# Patient Record
Sex: Male | Born: 1965
Health system: Southern US, Community
[De-identification: ages and names within clinical notes are randomized; demographics above are authoritative.]

## PROBLEM LIST (undated history)

## (undated) DIAGNOSIS — Z789 Other specified health status: Secondary | ICD-10-CM

## (undated) HISTORY — DX: Other specified health status: Z78.9

## (undated) HISTORY — PX: NO PAST SURGERIES: SHX2092

---

## 2004-12-10 ENCOUNTER — Emergency Department (HOSPITAL_COMMUNITY): Admission: EM | Admit: 2004-12-10 | Discharge: 2004-12-10 | Payer: Self-pay | Admitting: Family Medicine
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
## 2016-02-02 ENCOUNTER — Ambulatory Visit (INDEPENDENT_AMBULATORY_CARE_PROVIDER_SITE_OTHER): Payer: 59 | Admitting: Family Medicine

## 2016-02-02 ENCOUNTER — Encounter: Payer: Self-pay | Admitting: Family Medicine

## 2016-02-02 VITALS — BP 114/76 | HR 84 | Temp 98.0°F | Ht 71.0 in | Wt 205.4 lb

## 2016-02-02 DIAGNOSIS — R6889 Other general symptoms and signs: Secondary | ICD-10-CM | POA: Diagnosis not present

## 2016-02-02 DIAGNOSIS — Z0001 Encounter for general adult medical examination with abnormal findings: Secondary | ICD-10-CM | POA: Diagnosis not present

## 2016-02-02 DIAGNOSIS — Z720 Tobacco use: Secondary | ICD-10-CM

## 2016-02-02 DIAGNOSIS — S76301A Unspecified injury of muscle, fascia and tendon of the posterior muscle group at thigh level, right thigh, initial encounter: Secondary | ICD-10-CM

## 2016-02-02 DIAGNOSIS — M79671 Pain in right foot: Secondary | ICD-10-CM | POA: Diagnosis not present

## 2016-02-02 DIAGNOSIS — S76309A Unspecified injury of muscle, fascia and tendon of the posterior muscle group at thigh level, unspecified thigh, initial encounter: Secondary | ICD-10-CM | POA: Insufficient documentation

## 2016-02-02 MED ORDER — NICOTINE POLACRILEX 2 MG MT GUM: 2.0000 mg | CHEWING_GUM | OROMUCOSAL | Status: DC | PRN

## 2016-02-02 NOTE — Assessment & Plan Note (Signed)
Suspect midfoot arthritis given history and exam. Mild bony prominence today. Otherwise normal exam. Discussed obtaining an x-ray to evaluate given persistence, though patient declined this at this time. Patient will continue to monitor. Can ice and use ibuprofen as needed. Given return precautions.

## 2016-02-02 NOTE — Progress Notes (Signed)
Pre visit review using our clinic review tool, if applicable. No additional management support is needed unless otherwise documented below in the visit note. 

## 2016-02-02 NOTE — Assessment & Plan Note (Signed)
Patient has not had an annual exam in some time. No lab work recently. Discussed diet and exercise. Assistance provided in quitting smoking. Discussed prostate cancer screening and patient declined this at this time. Patient will return for lab work.

## 2016-02-02 NOTE — Assessment & Plan Note (Signed)
Suspect possible strain of right hamstring as cause of discomfort in this area. Benign exam today. Patient will continue to monitor. If recurs he will follow-up. Given return precautions.

## 2016-02-02 NOTE — Patient Instructions (Signed)
Nice to meet you. We will try the nicotine gum for smoking cessation. You can take this every 1-2 hours for the next 6 weeks. We will then work on decreasing this frequency. Please return at your convenience for lab work. Monitor the area on your foot and when it recurs please let us know so we can evaluate it. You can also use ice and ibuprofen for this.

## 2016-02-02 NOTE — Progress Notes (Signed)
Patient ID: Geoffrey MINUS, male   DOB: 1966/02/12, 50 y.o.   MRN: [id number]  Geoffrey Rumps, MD Phone: [phone]  Geoffrey Herrera is a 50 y.o. male who presents today for new patient visit.  Patient presents to establish care. Also for evaluation of his right foot.  Patient reports he does not exercise. 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 Diet consists of eating whatever he wants whenever he wants. His weight is up mildly as he has not been active over the winter. Drinks one beer per day. Smokes three quarters of a pack of cigarettes daily. Previously quit for about 10 years by going cold [place]. He notes cough related to smoking. No shortness of breath with this. Nonproductive. Coughs 2-3 times a day. No illicit drug use. Has not had lab work done in many years. No family history of prostate cancer.  Right foot discomfort: Patient notes intermittent since last summer the dorsal aspect of his right foot has hurt minimally. There is a swelling in the midfoot that comes and goes. No change in color or itching. No injury to this area. Does not bother him at this time.  Patient additionally notes the lateral aspect of his right distal hamstring tendon occasionally hurts when he has to squat for a long period of time. Notes this comes and goes. There is no swelling in this area. No pain at this time.  Active Ambulatory Problems    Diagnosis Date Noted  . Right foot pain 02/02/2016  . Hamstring injury 02/02/2016  . Tobacco abuse 02/02/2016  . Encounter for general adult medical examination with abnormal findings 02/02/2016   Resolved Ambulatory Problems    Diagnosis Date Noted  . No Resolved Ambulatory Problems   Past Medical History  Diagnosis Date  . Patient denies medical problems     Family History  Problem Relation Age of Onset  . Lung cancer      Uncle  . Diabetes Father   . Kidney disease Father   . Heart attack Father 101    Social History   Social  History  . Marital Status: Married    Spouse Name: N/A  . Number of Children: N/A  . Years of Education: N/A   Occupational History  . Not on file.   Social History Main Topics  . Smoking status: Current Every Day Smoker  . Smokeless tobacco: Not on file  . Alcohol Use: 3.0 oz/week    5 Standard drinks or equivalent per week     Comment: 5 beers a week   . Drug Use: No  . Sexual Activity: Not on file   Other Topics Concern  . Not on file   Social History Narrative  . No narrative on file    ROS   General:  Negative for nexplained weight loss, fever Skin: Negative for new or changing mole, sore that won't heal HEENT: Negative for trouble hearing, trouble seeing, ringing in ears, mouth sores, hoarseness, change in voice, dysphagia. CV:  Negative for chest pain, dyspnea, edema, palpitations Resp: Positive for cough, Negative for dyspnea, hemoptysis GI: Negative for nausea, vomiting, diarrhea, constipation, abdominal pain, melena, hematochezia. GU: Negative for dysuria, incontinence, urinary hesitance, hematuria, vaginal or penile discharge, polyuria, sexual difficulty, lumps in testicle or breasts MSK: Positive for right foot pain, Negative for muscle cramps or aches, joint pain or swelling Neuro: Negative for headaches, weakness, numbness, dizziness, passing out/fainting Psych: Negative for depression, anxiety, positive for memory  problems (forgets simple things like remembering his lunch)  Objective  Physical Exam Filed Vitals:   02/02/16 1026  BP: 114/76  Pulse: 84  Temp: 98 F (36.7 C)    BP Readings from Last 3 Encounters:  02/02/16 114/76   Wt Readings from Last 3 Encounters:  02/02/16 205 lb 6.4 oz (93.169 kg)    Physical Exam  Constitutional: He is well-developed, well-nourished, and in no distress.  HENT:  Head: Normocephalic and atraumatic.  Right Ear: External ear normal.  Left Ear: External ear normal.  Mouth/Throat: Oropharynx is clear and  moist. No oropharyngeal exudate.  Eyes: Conjunctivae are normal. Pupils are equal, round, and reactive to light.  Neck: Neck supple.  Cardiovascular: Normal rate, regular rhythm and normal heart sounds.  Exam reveals no gallop and no friction rub.   No murmur heard. Pulmonary/Chest: Effort normal and breath sounds normal. No respiratory distress. He has no wheezes. He has no rales.  Abdominal: Soft. He exhibits no distension. There is no tenderness. There is no rebound and no guarding.  Musculoskeletal:  Mild bony prominence noted in right dorsal midfoot that is nontender, nonerythematous, and has no surrounding edema, bilateral knees with no joint line tenderness, swelling, ligamentous laxity, and negative McMurray's, no tenderness of the right lateral distal hamstring tendons  Lymphadenopathy:    He has no cervical adenopathy.  Neurological: He is alert. Gait normal.  5 out of 5 strength bilateral quads, hamstrings, plantar flexion, and dorsiflexion, sensation light touch intact in bilateral lower extremities, 2+ patellar reflexes  Skin: Skin is warm and dry. He is not diaphoretic.  Psychiatric: Mood and affect normal.     Assessment/Plan:   Right foot pain Suspect midfoot arthritis given history and exam. Mild bony prominence today. Otherwise normal exam. Discussed obtaining an x-ray to evaluate given persistence, though patient declined this at this time. Patient will continue to monitor. Can ice and use ibuprofen as needed. Given return precautions.  Hamstring injury Suspect possible strain of right hamstring as cause of discomfort in this area. Benign exam today. Patient will continue to monitor. If recurs he will follow-up. Given return precautions.  Tobacco abuse Patient would like to quit smoking. Discussed options today. Patient wants to trial nicotine gum. Prescription provided.  Encounter for general adult medical examination with abnormal findings Patient has not had an  annual exam in some time. No lab work recently. Discussed diet and exercise. Assistance provided in quitting smoking. Discussed prostate cancer screening and patient declined this at this time. Patient will return for lab work.    Orders Placed This Encounter  Procedures  . Lipid Profile    Standing Status: Future     Number of Occurrences:      Standing Expiration Date: 02/01/2017  . Comp Met (CMET)    Standing Status: Future     Number of Occurrences:      Standing Expiration Date: 02/01/2017  . HgB A1c    Standing Status: Future     Number of Occurrences:      Standing Expiration Date: 02/01/2017  . CBC    Standing Status: Future     Number of Occurrences:      Standing Expiration Date: 02/01/2017  . TSH    Standing Status: Future     Number of Occurrences:      Standing Expiration Date: 02/01/2017    Meds ordered this encounter  Medications  . nicotine polacrilex (NICORETTE) 2 MG gum    Sig: Take 1 each (  2 mg total) by mouth as needed for smoking cessation.    Dispense:  100 tablet    Refill:  0     Geoffrey Herrera

## 2016-02-02 NOTE — Assessment & Plan Note (Signed)
Patient would like to quit smoking. Discussed options today. Patient wants to trial nicotine gum. Prescription provided.

## 2016-02-09 ENCOUNTER — Other Ambulatory Visit (INDEPENDENT_AMBULATORY_CARE_PROVIDER_SITE_OTHER): Payer: 59

## 2016-02-09 DIAGNOSIS — Z0001 Encounter for general adult medical examination with abnormal findings: Secondary | ICD-10-CM

## 2016-02-09 DIAGNOSIS — R6889 Other general symptoms and signs: Secondary | ICD-10-CM | POA: Diagnosis not present

## 2016-02-09 LAB — CBC
HEMATOCRIT: 44.2 % (ref 39.0–52.0)
Hemoglobin: 15 g/dL (ref 13.0–17.0)
MCHC: 33.8 g/dL (ref 30.0–36.0)
MCV: 89.3 fl (ref 78.0–100.0)
Platelets: 278 10*3/uL (ref 150.0–400.0)
RBC: 4.95 Mil/uL (ref 4.22–5.81)
RDW: 13.9 % (ref 11.5–15.5)
WBC: 9.3 10*3/uL (ref 4.0–10.5)

## 2016-02-09 LAB — COMPREHENSIVE METABOLIC PANEL
ALBUMIN: 4.4 g/dL (ref 3.5–5.2)
ALK PHOS: 46 U/L (ref 39–117)
ALT: 24 U/L (ref 0–53)
AST: 19 U/L (ref 0–37)
BUN: 13 mg/dL (ref 6–23)
CO2: 27 mEq/L (ref 19–32)
Calcium: 9.5 mg/dL (ref 8.4–10.5)
Chloride: 103 mEq/L (ref 96–112)
Creatinine, Ser: 1.11 mg/dL (ref 0.40–1.50)
GFR: 90.64 mL/min (ref >60.00)
Glucose, Bld: 104 mg/dL — ABNORMAL HIGH (ref 70–99)
Potassium: 4.3 mEq/L (ref 3.5–5.1)
Sodium: 137 mEq/L (ref 135–145)
Total Bilirubin: 0.6 mg/dL (ref 0.2–1.2)
Total Protein: 7.5 g/dL (ref 6.0–8.3)

## 2016-02-09 LAB — LIPID PANEL
Cholesterol: 124 mg/dL (ref 0–200)
HDL: 52.7 mg/dL (ref >39.00)
LDL Cholesterol: 36 mg/dL (ref 0–99)
NonHDL: 71
Total CHOL/HDL Ratio: 2
Triglycerides: 175 mg/dL — ABNORMAL HIGH (ref 0.0–149.0)
VLDL: 35 mg/dL (ref 0.0–40.0)

## 2016-02-09 LAB — HEMOGLOBIN A1C: Hgb A1c MFr Bld: 5.9 % (ref 4.6–6.5)

## 2016-02-09 LAB — TSH: TSH: 0.78 u[IU]/mL (ref 0.35–4.50)

## 2019-07-19 ENCOUNTER — Encounter: Payer: Self-pay | Admitting: Internal Medicine

## 2019-07-19 ENCOUNTER — Other Ambulatory Visit: Payer: Self-pay

## 2019-07-19 ENCOUNTER — Ambulatory Visit: Payer: 59 | Admitting: Internal Medicine

## 2019-07-19 VITALS — BP 128/84 | HR 62 | Temp 98.7°F | Ht 70.5 in | Wt 198.0 lb

## 2019-07-19 DIAGNOSIS — R1032 Left lower quadrant pain: Secondary | ICD-10-CM

## 2019-07-19 DIAGNOSIS — Z1211 Encounter for screening for malignant neoplasm of colon: Secondary | ICD-10-CM

## 2019-07-19 DIAGNOSIS — Z125 Encounter for screening for malignant neoplasm of prostate: Secondary | ICD-10-CM | POA: Diagnosis not present

## 2019-07-19 DIAGNOSIS — Z23 Encounter for immunization: Secondary | ICD-10-CM | POA: Diagnosis not present

## 2019-07-19 DIAGNOSIS — Z0000 Encounter for general adult medical examination without abnormal findings: Secondary | ICD-10-CM

## 2019-07-19 LAB — LIPID PANEL
Cholesterol: 114 mg/dL (ref 0–200)
HDL: 52.1 mg/dL (ref >39.00)
LDL Cholesterol: 33 mg/dL (ref 0–99)
NonHDL: 62.01
Total CHOL/HDL Ratio: 2
Triglycerides: 147 mg/dL (ref 0.0–149.0)
VLDL: 29.4 mg/dL (ref 0.0–40.0)

## 2019-07-19 LAB — CBC
HCT: 44.5 % (ref 39.0–52.0)
Hemoglobin: 15 g/dL (ref 13.0–17.0)
MCHC: 33.7 g/dL (ref 30.0–36.0)
MCV: 92.4 fl (ref 78.0–100.0)
Platelets: 283 10*3/uL (ref 150.0–400.0)
RBC: 4.81 Mil/uL (ref 4.22–5.81)
RDW: 13.3 % (ref 11.5–15.5)
WBC: 7.5 10*3/uL (ref 4.0–10.5)

## 2019-07-19 LAB — COMPREHENSIVE METABOLIC PANEL
ALT: 26 U/L (ref 0–53)
AST: 21 U/L (ref 0–37)
Albumin: 4.3 g/dL (ref 3.5–5.2)
Alkaline Phosphatase: 49 U/L (ref 39–117)
BUN: 11 mg/dL (ref 6–23)
CO2: 28 mEq/L (ref 19–32)
Calcium: 9.7 mg/dL (ref 8.4–10.5)
Chloride: 106 mEq/L (ref 96–112)
Creatinine, Ser: 1.06 mg/dL (ref 0.40–1.50)
GFR: 88.71 mL/min (ref >60.00)
Glucose, Bld: 88 mg/dL (ref 70–99)
Potassium: 5 mEq/L (ref 3.5–5.1)
Sodium: 139 mEq/L (ref 135–145)
Total Bilirubin: 0.5 mg/dL (ref 0.2–1.2)
Total Protein: 7.2 g/dL (ref 6.0–8.3)

## 2019-07-19 LAB — HEMOGLOBIN A1C: Hgb A1c MFr Bld: 6 % (ref 4.6–6.5)

## 2019-07-19 LAB — PSA: PSA: 0.84 ng/mL (ref 0.10–4.00)

## 2019-07-19 NOTE — Addendum Note (Signed)
Addended by: Lurlean Nanny on: 07/19/2019 03:41 PM   Modules accepted: Orders

## 2019-07-19 NOTE — Progress Notes (Signed)
HPI  Pt presents to the clinic today to establish care. He has not had a PCP in 3+ years.  Flu: never Tetanus: > 10 years ago PSA Screening: never Colon Screening: never Vision Screening: as needed Dentist: as needed  Diet: He does eat meat. He consumes fruits and veggies daily. He does eat some fried foods. He drinks mostly water, juice. Exercise: Weights  Past Medical History:  Diagnosis Date  . Patient denies medical problems     No current outpatient medications on file.   No current facility-administered medications for this visit.     No Known Allergies  Family History  Problem Relation Age of Onset  . Lung cancer Other        Uncle  . Diabetes Father   . Kidney disease Father   . Heart attack Father 42  . Cancer Paternal Grandmother     Social History   Socioeconomic History  . Marital status: Single    Spouse name: Not on file  . Number of children: Not on file  . Years of education: Not on file  . Highest education level: Not on file  Occupational History  . Not on file  Social Needs  . Financial resource strain: Not on file  . Food insecurity    Worry: Not on file    Inability: Not on file  . Transportation needs    Medical: Not on file    Non-medical: Not on file  Tobacco Use  . Smoking status: Current Every Day Smoker    Packs/day: 0.33    Types: Cigarettes  Substance and Sexual Activity  . Alcohol use: Yes    Alcohol/week: 5.0 standard drinks    Types: 5 Standard drinks or equivalent per week    Comment: occasional  . Drug use: No  . Sexual activity: Not on file  Lifestyle  . Physical activity    Days per week: Not on file    Minutes per session: Not on file  . Stress: Not on file  Relationships  . Social Herbalist on phone: Not on file    Gets together: Not on file    Attends religious service: Not on file    Active member of club or organization: Not on file    Attends meetings of clubs or organizations: Not on file     Relationship status: Not on file  . Intimate partner violence    Fear of current or ex partner: Not on file    Emotionally abused: Not on file    Physically abused: Not on file    Forced sexual activity: Not on file  Other Topics Concern  . Not on file  Social History Narrative  . Not on file    ROS:  Constitutional: Denies fever, malaise, fatigue, headache or abrupt weight changes.  HEENT: Denies eye pain, eye redness, ear pain, ringing in the ears, wax buildup, runny nose, nasal congestion, bloody nose, or sore throat. Respiratory: Denies difficulty breathing, shortness of breath, cough or sputum production.   Cardiovascular: Denies chest pain, chest tightness, palpitations or swelling in the hands or feet.  Gastrointestinal: Pt reports intermittent LLQ pain, worse with movement. Denies abdominal pain, bloating, constipation, diarrhea or blood in the stool.  GU: Denies frequency, urgency, pain with urination, blood in urine, odor or discharge. Musculoskeletal: Denies decrease in range of motion, difficulty with gait, muscle pain or joint pain and swelling.  Skin: Denies redness, rashes, lesions or ulcercations.  Neurological:  Denies dizziness, difficulty with memory, difficulty with speech or problems with balance and coordination.  Psych: Denies anxiety, depression, SI/HI.  No other specific complaints in a complete review of systems (except as listed in HPI above).  PE:  BP 128/84   Pulse 62   Temp 98.7 F (37.1 C) (Temporal)   Ht 5' 10.5" (1.791 m)   Wt 198 lb (89.8 kg)   SpO2 98%   BMI 28.01 kg/m  Wt Readings from Last 3 Encounters:  07/19/19 198 lb (89.8 kg)  02/02/16 205 lb 6.4 oz (93.2 kg)    General: Appears his stated age, well developed, well nourished in NAD. HEENT: Head: normal shape and size; Eyes: sclera white, no icterus, conjunctiva pink, PERRLA and EOMs intact; Ears: Tm's gray and intact, normal light reflex; Neck: Neck supple, trachea midline. No  masses, lumps or thyromegaly present.  Cardiovascular: Normal rate and rhythm. S1,S2 noted.  No murmur, rubs or gallops noted. No JVD or BLE edema. No carotid bruits noted. Pulmonary/Chest: Normal effort and positive vesicular breath sounds. No respiratory distress. No wheezes, rales or ronchi noted.  Abdomen: Soft and nontender. Normal bowel sounds. No distention or masses noted. Liver, spleen and kidneys non palpable. Musculoskeletal: Strength 5/5 BUE/BLE.  No difficulty with gait.  Neurological: Alert and oriented. Cranial nerves II-XII grossly intact. Coordination normal.  Psychiatric: Mood and affect normal. Behavior is normal. Judgment and thought content normal.     BMET    Component Value Date/Time   NA 137 02/09/2016 1000   K 4.3 02/09/2016 1000   CL 103 02/09/2016 1000   CO2 27 02/09/2016 1000   GLUCOSE 104 (H) 02/09/2016 1000   BUN 13 02/09/2016 1000   CREATININE 1.11 02/09/2016 1000   CALCIUM 9.5 02/09/2016 1000    Lipid Panel     Component Value Date/Time   CHOL 124 02/09/2016 1000   TRIG 175.0 (H) 02/09/2016 1000   HDL 52.70 02/09/2016 1000   CHOLHDL 2 02/09/2016 1000   VLDL 35.0 02/09/2016 1000   LDLCALC 36 02/09/2016 1000    CBC    Component Value Date/Time   WBC 9.3 02/09/2016 1000   RBC 4.95 02/09/2016 1000   HGB 15.0 02/09/2016 1000   HCT 44.2 02/09/2016 1000   PLT 278.0 02/09/2016 1000   MCV 89.3 02/09/2016 1000   MCHC 33.8 02/09/2016 1000   RDW 13.9 02/09/2016 1000    Hgb A1C Lab Results  Component Value Date   HGBA1C 5.9 02/09/2016     Assessment and Plan:  Preventive Health Maintenance:  Encouraged him to get a flu shot in the fall Tdap today Referral placed to GI for for screening colonoscopy Encouraged him to consume a balanced diet and exercise regimen Advised him to see an eye doctor and dentist Will check CBC, CMET, Lipid, PSA and A1C today  LLQ Pain:  Likely muscular Encouraged stretching, heat and Ibuprofen Will  monitor for now  RTC in 1 year, sooner if needed Nicki Reaperegina Cornell Bourbon, NP

## 2019-07-19 NOTE — Patient Instructions (Signed)

## 2019-09-07 ENCOUNTER — Encounter: Payer: Self-pay | Admitting: *Deleted

## 2019-09-27 ENCOUNTER — Other Ambulatory Visit: Payer: Self-pay

## 2019-09-27 DIAGNOSIS — Z20822 Contact with and (suspected) exposure to covid-19: Secondary | ICD-10-CM

## 2019-09-29 LAB — NOVEL CORONAVIRUS, NAA: SARS-CoV-2, NAA: NOT DETECTED

## 2019-10-02 ENCOUNTER — Telehealth: Payer: Self-pay | Admitting: Internal Medicine

## 2019-10-02 NOTE — Telephone Encounter (Signed)
Negative COVID results given. Patient results "NOT Detected." Caller expressed understanding. ° °

## 2020-02-22 ENCOUNTER — Telehealth: Payer: Self-pay

## 2020-02-22 NOTE — Telephone Encounter (Signed)
Referral closed for colon cancer screening ordered 07-19-2019.  Pt requested  GI.  See referral notes referral hx.  [facility] mailed Unable to contact letter 09-07-2019.

## 2020-03-11 ENCOUNTER — Telehealth: Payer: Self-pay | Admitting: Internal Medicine

## 2020-03-11 NOTE — Telephone Encounter (Signed)
noted 

## 2020-03-11 NOTE — Telephone Encounter (Signed)
Previous msg forwarded to [employer]

## 2020-03-11 NOTE — Telephone Encounter (Signed)
Pt called stating he called [facility] and they told him he needs another referral to go there.      Gave pt [facility] referral number [phone]

## 2020-03-13 ENCOUNTER — Ambulatory Visit (INDEPENDENT_AMBULATORY_CARE_PROVIDER_SITE_OTHER): Payer: Self-pay | Admitting: Gastroenterology

## 2020-03-13 ENCOUNTER — Other Ambulatory Visit: Payer: Self-pay

## 2020-03-13 VITALS — Ht 70.5 in | Wt 193.8 lb

## 2020-03-13 DIAGNOSIS — Z1211 Encounter for screening for malignant neoplasm of colon: Secondary | ICD-10-CM

## 2020-03-13 MED ORDER — NA SULFATE-K SULFATE-MG SULF 17.5-3.13-1.6 GM/177ML PO SOLN: 1.0000 | Freq: Once | ORAL | 0 refills | Status: AC

## 2020-03-13 NOTE — Progress Notes (Signed)
Gastroenterology Pre-Procedure Review  Request Date: Monday 03/31/20 Requesting Physician: Dr. Tobi Bastos  PATIENT REVIEW QUESTIONS: The patient responded to the following health history questions as indicated:    1. Are you having any GI issues? no 2. Do you have a personal history of Polyps? no 3. Do you have a family history of Colon Cancer or Polyps? yes (mother colon polyps) 4. Diabetes Mellitus? no 5. Joint replacements in the past 12 months?no 6. Major health problems in the past 3 months?no 7. Any artificial heart valves, MVP, or defibrillator?no    MEDICATIONS & ALLERGIES:    Patient reports the following regarding taking any anticoagulation/antiplatelet therapy:   Plavix, Coumadin, Eliquis, Xarelto, Lovenox, Pradaxa, Brilinta, or Effient? no Aspirin? no  Patient confirms/reports the following medications:  No current outpatient medications on file.   No current facility-administered medications for this visit.    Patient confirms/reports the following allergies:  No Known Allergies  No orders of the defined types were placed in this encounter.   AUTHORIZATION INFORMATION Primary Insurance: 1D#: Group #:  Secondary Insurance: 1D#: Group #:  SCHEDULE INFORMATION: Date: 03/31/20 Time: Location:[facility]

## 2020-03-13 NOTE — Patient Instructions (Signed)
Colonoscopy scheduled with Dr. Wyline Mood on Monday 03/31/20 at [facility].  Call the Endoscopy Dept on Friday 03/28/20 at 1pm for arrival time.  [phone].  COVID Test has been scheduled for Thursday 03/27/20 at [facility].  This is drive up testing arrive between the hours of 8am-1pm.  Please review Colonoscopy Instructions again prior to procedure.  Call the office if you have any questions.  Have a great week!  Marcelino Duster, CMA [facility] [phone]

## 2020-03-27 ENCOUNTER — Other Ambulatory Visit
Admission: RE | Admit: 2020-03-27 | Discharge: 2020-03-27 | Disposition: A | Discharge status: Home / Self Care | Payer: PRIVATE HEALTH INSURANCE | Source: Ambulatory Visit | Attending: Gastroenterology | Admitting: Gastroenterology

## 2020-03-27 DIAGNOSIS — Z20822 Contact with and (suspected) exposure to covid-19: Secondary | ICD-10-CM | POA: Insufficient documentation

## 2020-03-27 DIAGNOSIS — Z01812 Encounter for preprocedural laboratory examination: Secondary | ICD-10-CM | POA: Diagnosis not present

## 2020-03-27 LAB — SARS CORONAVIRUS 2 (TAT 6-24 HRS): SARS Coronavirus 2: NEGATIVE

## 2020-03-31 ENCOUNTER — Encounter: Payer: Self-pay | Admitting: Gastroenterology

## 2020-03-31 ENCOUNTER — Encounter
Admission: RE | Disposition: A | Discharge status: Home / Self Care | Payer: Self-pay | Source: Home / Self Care | Attending: Gastroenterology

## 2020-03-31 ENCOUNTER — Ambulatory Visit: Payer: PRIVATE HEALTH INSURANCE | Admitting: Anesthesiology

## 2020-03-31 ENCOUNTER — Ambulatory Visit
Admission: RE | Admit: 2020-03-31 | Discharge: 2020-03-31 | Disposition: A | Discharge status: Home / Self Care | Payer: PRIVATE HEALTH INSURANCE | Attending: Gastroenterology | Admitting: Gastroenterology

## 2020-03-31 ENCOUNTER — Other Ambulatory Visit: Payer: Self-pay

## 2020-03-31 DIAGNOSIS — Z1211 Encounter for screening for malignant neoplasm of colon: Secondary | ICD-10-CM | POA: Insufficient documentation

## 2020-03-31 DIAGNOSIS — Z8371 Family history of colonic polyps: Secondary | ICD-10-CM | POA: Insufficient documentation

## 2020-03-31 DIAGNOSIS — K635 Polyp of colon: Secondary | ICD-10-CM | POA: Insufficient documentation

## 2020-03-31 DIAGNOSIS — F17210 Nicotine dependence, cigarettes, uncomplicated: Secondary | ICD-10-CM | POA: Insufficient documentation

## 2020-03-31 HISTORY — PX: COLONOSCOPY WITH PROPOFOL: SHX5780

## 2020-03-31 SURGERY — COLONOSCOPY WITH PROPOFOL
Anesthesia: General

## 2020-03-31 MED ORDER — SODIUM CHLORIDE 0.9 % IV SOLN
INTRAVENOUS | Status: DC
  Administered 2020-03-31: 1000 mL via INTRAVENOUS

## 2020-03-31 MED ORDER — PROPOFOL 10 MG/ML IV BOLUS
INTRAVENOUS | Status: AC
  Filled 2020-03-31: qty 20

## 2020-03-31 MED ORDER — PROPOFOL 10 MG/ML IV BOLUS
INTRAVENOUS | Status: DC | PRN
  Administered 2020-03-31: 50 mg via INTRAVENOUS
  Administered 2020-03-31 (×3): 20 mg via INTRAVENOUS
  Administered 2020-03-31 (×2): 30 mg via INTRAVENOUS
  Administered 2020-03-31: 100 mg via INTRAVENOUS

## 2020-03-31 NOTE — Transfer of Care (Signed)
Immediate Anesthesia Transfer of Care Note  Patient: Geoffrey Herrera  Procedure(s) Performed: COLONOSCOPY WITH PROPOFOL (N/A )  Patient Location: PACU and Endoscopy Unit  Anesthesia Type:General  Level of Consciousness: awake, alert  and oriented  Airway & Oxygen Therapy: Patient Spontanous Breathing  Post-op Assessment: Report given to RN and Post -op Vital signs reviewed and stable  Post vital signs: Reviewed and stable  Last Vitals:  Vitals Value Taken Time  BP    Temp    Pulse    Resp    SpO2      Last Pain:  Vitals:   03/31/20 0848  TempSrc: Tympanic  PainSc: 0-No pain         Complications: No apparent anesthesia complications

## 2020-03-31 NOTE — Anesthesia Postprocedure Evaluation (Signed)
Anesthesia Post Note  Patient: Geoffrey Herrera  Procedure(s) Performed: COLONOSCOPY WITH PROPOFOL (N/A )  Patient location during evaluation: Endoscopy Anesthesia Type: General Level of consciousness: awake and alert and oriented Pain management: pain level controlled Vital Signs Assessment: post-procedure vital signs reviewed and stable Respiratory status: spontaneous breathing Cardiovascular status: blood pressure returned to baseline Anesthetic complications: no     Last Vitals:  Vitals:   03/31/20 0848 03/31/20 1000  BP: 137/87 101/78  Pulse: 75 70  Resp: 18 20  Temp: (!) 36.2 C (!) 36 C  SpO2: 100% 99%    Last Pain:  Vitals:   03/31/20 1043  TempSrc:   PainSc: 0-No pain                 [name]

## 2020-03-31 NOTE — Anesthesia Preprocedure Evaluation (Signed)
Anesthesia Evaluation  Patient identified by MRN, date of birth, ID band Patient awake    Reviewed: Allergy & Precautions, NPO status , Patient's Chart, lab work & pertinent test results  Airway Mallampati: II       Dental   Pulmonary Current Smoker,           Cardiovascular negative cardio ROS       Neuro/Psych negative neurological ROS  negative psych ROS   GI/Hepatic negative GI ROS, Neg liver ROS,   Endo/Other  negative endocrine ROS  Renal/GU negative Renal ROS  negative genitourinary   Musculoskeletal negative musculoskeletal ROS (+)   Abdominal   Peds negative pediatric ROS (+)  Hematology negative hematology ROS (+)   Anesthesia Other Findings Past Medical History: No date: Patient denies medical problems  Reproductive/Obstetrics                             Anesthesia Physical Anesthesia Plan  ASA: II  Anesthesia Plan: General   Post-op Pain Management:    Induction: Intravenous  PONV Risk Score and Plan: Propofol infusion  Airway Management Planned: Nasal Cannula  Additional Equipment:   Intra-op Plan:   Post-operative Plan:   Informed Consent: I have reviewed the patients History and Physical, chart, labs and discussed the procedure including the risks, benefits and alternatives for the proposed anesthesia with the patient or authorized representative who has indicated his/her understanding and acceptance.     Dental advisory given  Plan Discussed with: CRNA and Surgeon  Anesthesia Plan Comments:         Anesthesia Quick Evaluation

## 2020-03-31 NOTE — Op Note (Signed)
[facility] Gastroenterology Patient Name: Geoffrey Herrera Procedure Date: 03/31/2020 9:58 AM MRN: [id number] Account #: [id number] Date of Birth: January 17, 1966 Admit Type: Outpatient Age: 54 Room: [facility] ENDO ROOM 4 Gender: Male Note Status: Finalized Procedure:             Colonoscopy Indications:           Colon cancer screening in patient at increased risk:                         Family history of 1st-degree relative with colon polyps Providers:             Wyline Mood MD, MD Referring MD:          No Local Md, MD (Referring MD) Medicines:             Monitored Anesthesia Care Complications:         No immediate complications. Procedure:             Pre-Anesthesia Assessment:                        - Prior to the procedure, a History and Physical was                         performed, and patient medications, allergies and                         sensitivities were reviewed. The patient's tolerance                         of previous anesthesia was reviewed.                        - The risks and benefits of the procedure and the                         sedation options and risks were discussed with the                         patient. All questions were answered and informed                         consent was obtained.                        - ASA Grade Assessment: II - A patient with mild                         systemic disease.                        After obtaining informed consent, the colonoscope was                         passed under direct vision. Throughout the procedure,                         the patient's blood pressure, pulse, and oxygen  saturations were monitored continuously. The                         Colonoscope was introduced through the anus and                         advanced to the the cecum, identified by the                         appendiceal orifice. The colonoscopy was performed                         with  ease. The patient tolerated the procedure well.                         The quality of the bowel preparation was excellent. Findings:      The perianal and digital rectal examinations were normal.      A 3 mm polyp was found in the transverse colon. The polyp was sessile.       The polyp was removed with a cold biopsy forceps. Resection and       retrieval were complete.      The exam was otherwise without abnormality on direct and retroflexion       views. Impression:            - One 3 mm polyp in the transverse colon, removed with                         a cold biopsy forceps. Resected and retrieved.                        - The examination was otherwise normal on direct and                         retroflexion views. Recommendation:        - Discharge patient to home (with escort).                        - Resume previous diet.                        - Continue present medications.                        - Await pathology results.                        - Repeat colonoscopy for surveillance based on                         pathology results. Procedure Code(s):     --- Professional ---                        [phone], Colonoscopy, flexible; with biopsy, single or                         multiple Diagnosis Code(s):     --- Professional ---  Z83.71, Family history of colonic polyps                        K63.5, Polyp of colon CPT copyright 2019 [employer]. All rights reserved. The codes documented in this report are preliminary and upon coder review may  be revised to meet current compliance requirements. Wyline Mood, MD Wyline Mood MD, MD 03/31/2020 10:20:10 AM This report has been signed electronically. Number of Addenda: 0 Note Initiated On: 03/31/2020 9:58 AM Scope Withdrawal Time: 0 hours 12 minutes 51 seconds  Total Procedure Duration: 0 hours 15 minutes 35 seconds  Estimated Blood Loss:  Estimated blood loss: none.      [facility]

## 2020-03-31 NOTE — H&P (Signed)
Jonathon Bellows, MD [address] [place], [place], [place], [address] [address], [place], [place], [place], [address] Phone: [phone]  Fax: [phone]  Primary Care Physician:  Jearld Fenton, NP   Pre-Procedure History & Physical: HPI:  Geoffrey Herrera is a 54 y.o. male is here for an colonoscopy.   Past Medical History:  Diagnosis Date  . Patient denies medical problems     Past Surgical History:  Procedure Laterality Date  . NO PAST SURGERIES      Prior to Admission medications   Not on File    Allergies as of 03/13/2020  . (No Known Allergies)    Family History  Problem Relation Age of Onset  . Lung cancer Other        Uncle  . Diabetes Father   . Kidney disease Father   . Heart attack Father 48  . Cancer Paternal Grandmother     Social History   Socioeconomic History  . Marital status: Single    Spouse name: Not on file  . Number of children: Not on file  . Years of education: Not on file  . Highest education level: Not on file  Occupational History  . Not on file  Tobacco Use  . Smoking status: Current Every Day Smoker    Packs/day: 0.33    Types: Cigarettes  . Smokeless tobacco: Never Used  Substance and Sexual Activity  . Alcohol use: Yes    Alcohol/week: 5.0 standard drinks    Types: 5 Standard drinks or equivalent per week    Comment: occasional  . Drug use: No  . Sexual activity: Not on file  Other Topics Concern  . Not on file  Social History Narrative  . Not on file   Social Determinants of Health   Financial Resource Strain:   . Difficulty of Paying Living Expenses:   Food Insecurity:   . Worried About Charity fundraiser in the Last Year:   . Arboriculturist in the Last Year:   Transportation Needs:   . Film/video editor (Medical):   Marland Kitchen Lack of Transportation (Non-Medical):   Physical Activity:   . Days of Exercise per Week:   . Minutes of Exercise per Session:   Stress:   . Feeling of Stress :    Social Connections:   . Frequency of Communication with Friends and Family:   . Frequency of Social Gatherings with Friends and Family:   . Attends Religious Services:   . Active Member of Clubs or Organizations:   . Attends Archivist Meetings:   Marland Kitchen Marital Status:   Intimate Partner Violence:   . Fear of Current or Ex-Partner:   . Emotionally Abused:   Marland Kitchen Physically Abused:   . Sexually Abused:     Review of Systems: See HPI, otherwise negative ROS  Physical Exam: BP 137/87   Pulse 75   Temp (!) 97.2 F (36.2 C) (Tympanic)   Resp 18   Ht 5' 10.5" (1.791 m)   Wt 88.5 kg   SpO2 100%   BMI 27.58 kg/m  General:   Alert,  pleasant and cooperative in NAD Head:  Normocephalic and atraumatic. Neck:  Supple; no masses or thyromegaly. Lungs:  Clear throughout to auscultation, normal respiratory effort.    Heart:  +S1, +S2, Regular rate and rhythm, No edema. Abdomen:  Soft, nontender and nondistended. Normal bowel sounds, without guarding, and without rebound.   Neurologic:  Alert and  oriented x4;  grossly normal neurologically.  Impression/Plan: Geoffrey Herrera is here for an colonoscopy to be performed for Screening colonoscopy family hyistory of colon polyps Risks, benefits, limitations, and alternatives regarding  colonoscopy have been reviewed with the patient.  Questions have been answered.  All parties agreeable.   Wyline Mood, MD  03/31/2020, 9:46 AM

## 2020-04-02 ENCOUNTER — Encounter: Payer: Self-pay | Admitting: *Deleted

## 2020-04-02 LAB — SURGICAL PATHOLOGY

## 2020-04-06 ENCOUNTER — Encounter: Payer: Self-pay | Admitting: Gastroenterology

## 2021-07-11 ENCOUNTER — Other Ambulatory Visit: Payer: Self-pay

## 2021-07-11 ENCOUNTER — Emergency Department
Admission: EM | Admit: 2021-07-11 | Discharge: 2021-07-11 | Disposition: A | Discharge status: Home / Self Care | Payer: PRIVATE HEALTH INSURANCE | Attending: Emergency Medicine | Admitting: Emergency Medicine

## 2021-07-11 DIAGNOSIS — Z5321 Procedure and treatment not carried out due to patient leaving prior to being seen by health care provider: Secondary | ICD-10-CM | POA: Diagnosis not present

## 2021-07-11 DIAGNOSIS — X088XXA Exposure to other specified smoke, fire and flames, initial encounter: Secondary | ICD-10-CM | POA: Diagnosis not present

## 2021-07-11 DIAGNOSIS — T22111A Burn of first degree of right forearm, initial encounter: Secondary | ICD-10-CM | POA: Diagnosis not present

## 2021-07-11 DIAGNOSIS — Y9389 Activity, other specified: Secondary | ICD-10-CM | POA: Diagnosis not present
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
